# Patient Record
Sex: Male | Born: 1990 | Race: Black or African American | Hispanic: No | Marital: Single | Smoking: Current every day smoker
Health system: Southern US, Community
[De-identification: ages and names within clinical notes are randomized; demographics above are authoritative.]

## PROBLEM LIST (undated history)

## (undated) DIAGNOSIS — G43909 Migraine, unspecified, not intractable, without status migrainosus: Secondary | ICD-10-CM

## (undated) DIAGNOSIS — J45909 Unspecified asthma, uncomplicated: Secondary | ICD-10-CM

## (undated) DIAGNOSIS — J302 Other seasonal allergic rhinitis: Secondary | ICD-10-CM

---
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
## 2016-01-03 ENCOUNTER — Encounter (HOSPITAL_BASED_OUTPATIENT_CLINIC_OR_DEPARTMENT_OTHER): Payer: Self-pay | Admitting: *Deleted

## 2016-01-03 ENCOUNTER — Emergency Department (HOSPITAL_BASED_OUTPATIENT_CLINIC_OR_DEPARTMENT_OTHER)
Admission: EM | Admit: 2016-01-03 | Discharge: 2016-01-03 | Disposition: A | Discharge status: Home / Self Care | Payer: Medicaid Other | Attending: Emergency Medicine | Admitting: Emergency Medicine

## 2016-01-03 DIAGNOSIS — K0889 Other specified disorders of teeth and supporting structures: Secondary | ICD-10-CM | POA: Diagnosis not present

## 2016-01-03 DIAGNOSIS — F17210 Nicotine dependence, cigarettes, uncomplicated: Secondary | ICD-10-CM | POA: Insufficient documentation

## 2016-01-03 DIAGNOSIS — Z8679 Personal history of other diseases of the circulatory system: Secondary | ICD-10-CM | POA: Insufficient documentation

## 2016-01-03 DIAGNOSIS — J45909 Unspecified asthma, uncomplicated: Secondary | ICD-10-CM | POA: Diagnosis not present

## 2016-01-03 DIAGNOSIS — J029 Acute pharyngitis, unspecified: Secondary | ICD-10-CM | POA: Diagnosis not present

## 2016-01-03 HISTORY — DX: Migraine, unspecified, not intractable, without status migrainosus: G43.909

## 2016-01-03 HISTORY — DX: Other seasonal allergic rhinitis: J30.2

## 2016-01-03 HISTORY — DX: Unspecified asthma, uncomplicated: J45.909

## 2016-01-03 LAB — RAPID STREP SCREEN (MED CTR MEBANE ONLY): STREPTOCOCCUS, GROUP A SCREEN (DIRECT): NEGATIVE

## 2016-01-03 MED ORDER — BUPIVACAINE-EPINEPHRINE (PF) 0.5% -1:200000 IJ SOLN
1.8000 mL | Freq: Once | INTRAMUSCULAR | Status: AC
  Administered 2016-01-03: 1.8 mL
  Filled 2016-01-03: qty 1.8

## 2016-01-03 MED ORDER — OXYCODONE-ACETAMINOPHEN 5-325 MG PO TABS: 1.0000 | ORAL_TABLET | ORAL | Status: DC | PRN

## 2016-01-03 MED ORDER — PENICILLIN V POTASSIUM 500 MG PO TABS: 500.0000 mg | ORAL_TABLET | Freq: Four times a day (QID) | ORAL | Status: AC

## 2016-01-03 MED ORDER — OXYCODONE-ACETAMINOPHEN 5-325 MG PO TABS
1.0000 | ORAL_TABLET | Freq: Once | ORAL | Status: AC
Start: 2016-01-03 — End: 2016-01-03
  Administered 2016-01-03: 1 via ORAL
  Filled 2016-01-03: qty 1

## 2016-01-03 MED ORDER — PENICILLIN V POTASSIUM 500 MG PO TABS: 500.0000 mg | ORAL_TABLET | Freq: Four times a day (QID) | ORAL | Status: DC

## 2016-01-03 NOTE — ED Provider Notes (Signed)
CSN: [id number]     Arrival date & time 01/03/16  1348 History   First MD Initiated Contact with Patient 01/03/16 1540     Chief Complaint  Patient presents with  . Sore Throat  . Dental Pain     (Consider location/radiation/quality/duration/timing/severity/associated sxs/prior Treatment) HPI   Javier Randolph is a 25 y.o. male, patient with no pertinent past medical history, presenting to the ED with bilateral lower jaw pain and a sore throat that began yesterday. Pt rates his sore throat at 4/10, aching, nonradiating. Pt rates his tooth pain at 4/10, throbbing, nonradiating. Both pains improved with percocet in triage. Pt has tried tylenol at home with minimal relief. Pt denies fever/chills, nausea/vomiting, cough, shortness of breath or difficulty swallowing, or any other complaints.     Past Medical History  Diagnosis Date  . Asthma   . Migraines   . Seasonal allergies    History reviewed. No pertinent past surgical history. No family history on file. Social History  Substance Use Topics  . Smoking status: Current Every Day Smoker    Types: Cigars  . Smokeless tobacco: Never Used  . Alcohol Use: Yes     Comment: 3beers/week    Review of Systems  Constitutional: Negative for fever and chills.  HENT: Positive for dental problem and sore throat.   Respiratory: Negative for shortness of breath.   Cardiovascular: Negative for chest pain.  Gastrointestinal: Negative for nausea and vomiting.  Skin: Negative for color change and pallor.  Neurological: Negative for headaches.    Allergies  Motrin  Home Medications   Prior to Admission medications   Medication Sig Start Date End Date Taking? Authorizing Provider  oxyCODONE-acetaminophen (PERCOCET/ROXICET) 5-325 MG tablet Take 1 tablet by mouth every 4 (four) hours as needed for severe pain. 01/03/16   Javier Hankin Randolph Lashae Wollenberg, PA-Randolph  penicillin v potassium (VEETID) 500 MG tablet Take 1 tablet (500 mg total) by mouth 4 (four) times  daily. 01/03/16 01/10/16  Javier Randolph Randolph Jayland Null, PA-Randolph   BP 131/85 mmHg  Pulse 56  Temp(Src) 98.7 F (37.1 Randolph) (Oral)  Resp 16  Ht 5\' 9"  (1.753 m)  Wt 70.308 kg  BMI 22.88 kg/m2  SpO2 99% Physical Exam  Constitutional: He appears well-developed and well-nourished. No distress.  HENT:  Head: Normocephalic and atraumatic.  Mouth/Throat: Uvula is midline and mucous membranes are normal. Posterior oropharyngeal erythema present. No oropharyngeal exudate, posterior oropharyngeal edema or tonsillar abscesses.  Tenderness bilaterally to the gingiva posterior to the rearmost lower molars. No discernible swelling or fluctuance that would suggest an abscess.  Eyes: Conjunctivae are normal.  Neck: Normal range of motion.  Cardiovascular: Normal rate and regular rhythm.   Pulmonary/Chest: Effort normal and breath sounds normal.  Lymphadenopathy:    He has no cervical adenopathy.  Neurological: He is alert.  Skin: Skin is warm and dry. He is not diaphoretic.  Nursing note and vitals reviewed.   ED Course  .Nerve Block Date/Time: 01/03/2016 4:07 PM Performed by: Javier Randolph, Javier Randolph Authorized by: Javier Randolph, Javier Randolph Randolph Consent: Verbal consent obtained. Risks and benefits: risks, benefits and alternatives were discussed Consent given by: patient Patient understanding: patient states understanding of the procedure being performed Patient consent: the patient's understanding of the procedure matches consent given Procedure consent: procedure consent matches procedure scheduled Patient identity confirmed: verbally with patient and arm band Time out: Immediately prior to procedure a "time out" was called to verify the correct patient, procedure, equipment, support staff and site/side marked as  required. Indications: pain relief Body area: face/mouth Nerve: inferior alveolar Laterality: right Patient sedated: no Patient position: supine Needle gauge: 27 G Location technique: anatomical landmarks Local anesthetic:  bupivacaine 0.5% with epinephrine Anesthetic total: 1.8 ml Outcome: pain improved Patient tolerance: Patient tolerated the procedure well with no immediate complications  .Nerve Block Date/Time: 01/03/2016 4:07 PM Performed by: Javier Pancoast Authorized by: Javier Pancoast Consent: Verbal consent obtained. Risks and benefits: risks, benefits and alternatives were discussed Consent given by: patient Patient understanding: patient states understanding of the procedure being performed Patient consent: the patient's understanding of the procedure matches consent given Procedure consent: procedure consent matches procedure scheduled Patient identity confirmed: verbally with patient and arm band Time out: Immediately prior to procedure a "time out" was called to verify the correct patient, procedure, equipment, support staff and site/side marked as required. Indications: pain relief Body area: face/mouth Nerve: inferior alveolar Laterality: left Patient sedated: no Patient position: supine Needle gauge: 27 G Location technique: anatomical landmarks Local anesthetic: bupivacaine 0.5% with epinephrine Anesthetic total: 1.8 ml Outcome: pain improved Patient tolerance: Patient tolerated the procedure well with no immediate complications   (including critical care time) Labs Review Labs Reviewed  RAPID STREP SCREEN (NOT AT [facility])  CULTURE, GROUP A STREP [facility])    Imaging Review No results found. I have personally reviewed and evaluated these lab results as part of my medical decision-making.   EKG Interpretation None      MDM   Final diagnoses:  Sore throat  Pain, dental    Javier Randolph presents with bilateral jaw pain and sore throat since yesterday morning.  Suspect that the source of the patient's job pain is possibly impending eruption of wisdom teeth. Sore throat is likely a viral pharyngitis. Inferior alveolar blocks improved pain. Patient placed on an antibiotic in  anticipation of possible tooth extraction. Patient was advised that he would need to follow-up with a dentist within the timeframe that he is still on antibiotic. Home care and return precautions discussed. Patient voiced understanding of these instructions and is comfortable with discharge.    Javier Pancoast, PA-Randolph 01/03/16 1631  Marily Memos, MD 01/06/16 [phone]

## 2016-01-03 NOTE — ED Notes (Signed)
Pt reports sore throat since yesterday morning; also reports L upper tooth pain. Throat slightly red. Minimal swelling to L upper, back molar area. Denies fever, cough, other concerning symptoms.

## 2016-01-03 NOTE — Discharge Instructions (Signed)
You have been seen today for dental pain and sore throat.  Sore throat: There strep test was negative, indicating that the source of her illness is likely a virus. Viruses do not require antibiotics. Treatment is symptomatic care. Drink plenty of fluids and get plenty of rest. Warm liquids or Chloraseptic spray may help soothe the sore throat.  Tooth pain: You must follow-up with a dentist as soon as possible, but certainly within the time you're still taking the antibiotic. Follow up with PCP as needed. Return to ED should symptoms worsen.  RESOURCE GUIDE  Chronic Pain Problems: Contact [facility]  [phone] Patients need to be referred by their primary care doctor.  Insufficient Money for Medicine: Contact United Way:  call "211" or [facility] [phone].  No Primary Care Doctor: - Call [facility] Connect  [phone] - can help you locate a primary care doctor that  accepts your insurance, provides certain services, etc. - Physician Referral Service- [phone]  Agencies that provide inexpensive medical care: - [facility]  [phone] - [facility]  [phone] - [facility]  [phone] - Women's Clinic  [phone] - Planned Parenthood  [phone] Haynes Bast[facility]  [phone]  Medicaid-accepting [place] [facility] Providers: - [facility]- [address], [address]  [phone], Mon-Fri 9am-7pm, Sat 9am-1pm - [facility]- [address], [address] [place]  [phone] - [facility]- [address], [address] [place]  [phone] [facility]- [facility]- [address]  [phone] - Renaye RakersVeita Bland- [address], [address], [phone]  Only accepts [place] patients after they have their name  applied to their card  Self Pay (no insurance) [place]: - Sickle Cell Patients: Dr Willey BladeEric Dean, [place] [facility]  [address], [phone] - [facility]- [address]  [phone]       Redge Gainer-     [facility] [place]- [address], [address]       -     [facility]- see information above (Speak to CitigroupPam H if you do not have insurance)       -  [facility]- [address], [phone]       -  [facility]- [address],  [phone]       -  [facility]- [address], [phone]       -  Dr Julio Sickssei-Bonsu-  [address], [address], [place], [phone]       -  [facility]- [address], [phone]       -  [facility] [place]- [address], [phone], also [address], [phone]       -    [facility]- [address], [phone], 1st & 3rd Saturday   every month, 10am-1pm  1) Find a Doctor and Pay Out of Pocket Although you won't have to find out who is covered by your insurance plan, it is a good idea to ask around and get recommendations. You will then need to call the office and see if the doctor you have chosen will accept you as a new patient and what types of options they offer for patients who are self-pay. Some doctors offer discounts or will set up payment plans  for their patients who do not have insurance, but you will need to ask so you aren't surprised when you get to your appointment.  2) Contact Your Local Health Department Not all health departments have doctors that can see patients for sick visits, but many do, so it is worth a call to see if yours does. If you don't know where your local health department is, you can check in your phone book. The CDC also has a tool to help you locate your state's health department, and many state websites also have listings of all of their local health departments.  3) Find a Walk-in Clinic If your illness is not likely to be very severe or complicated, you may want to try a walk in clinic. These are popping up all over the country in  pharmacies, drugstores, and shopping centers. They're usually staffed by nurse practitioners or physician assistants that have been trained to treat common illnesses and complaints. They're usually fairly quick and inexpensive. However, if you have serious medical issues or chronic medical problems, these are probably not your best option  STD Testing - [facility] Department of [facility] [place], STD Clinic, [address], [place], phone [phone] or [phone].  Monday - Friday, call for an appointment. [facility] Department of [employer], STD Clinic, [address], [place], phone [phone] or [phone].  Monday - Friday, call for an appointment.  Abuse/Neglect: [facility] Child Abuse Hotline 2516913567 [facility] Child Abuse Hotline 281-295-7829 (After Hours)  Emergency Shelter:  [facility] 919-376-3847  Maternity Homes: - [facility] at [facility] [phone] - [facility] [phone]  MRSA Hotline #:   289-821-6713  [facility] Resources  [facility]  United Way [facility]. [address]                 [address]         [address] [place] [address]  Blondell Reveal Phone:  [phone]                                  Phone:  [phone]                   Phone:  [phone]  [facility], [phone] - [facility] - [facility]670 [phone]       -     [facility] in [place], [address]                                  [phone], Insurance  [place] Child Abuse Hotline 8728272317 or 512-368-6638 (After Hours)   Behavioral Health Services  Substance Abuse Resources: - Alcohol and Drug Services  [phone] - [facility]  [phone] -  [facility] [place] [phone] Floydene Flock [phone] - Residential & Outpatient Substance Abuse Program  [phone]  Psychological Services: [facility]  [phone] Services  [phone] - [facility], [phone] [place]. [address], [place], ACCESS LINE: 902-142-1781 or 712-101-7987, [employer].za  Dental Assistance  If unable to pay or uninsured, contact:  [facility] or [facility]. to become qualified for the adult dental clinic.  Patients with Medicaid: [facility] [phone], [phone] [address] [phone]  If unable to pay, or uninsured, contact [facility] [phone]) or [facility] Department [phone] in [place], [phone] in [facility]) to become qualified for the adult dental clinic   Other Low-Cost Community Dental Services: - [facility]- [address] [place], [place], [place], [address], [phone], Ext. [phone], 2nd and 4th Thursday of the month at 6:30am.  10 clients each day by appointment, can sometimes see walk-in patients if someone does not show for an appointment. [facility]- [address] [place], [place], [address], [phone] - [facility]- [address] [place], [place], [address], [phone] - [facility]- [phone] [facility] Department- [phone] [facility] Department- [phone]

## 2016-01-05 LAB — CULTURE, GROUP A STREP (THRC)

## 2016-03-03 ENCOUNTER — Emergency Department (HOSPITAL_COMMUNITY): Payer: Medicaid Other

## 2016-03-03 ENCOUNTER — Encounter (HOSPITAL_COMMUNITY): Payer: Self-pay

## 2016-03-03 ENCOUNTER — Emergency Department (HOSPITAL_COMMUNITY)
Admission: EM | Admit: 2016-03-03 | Discharge: 2016-03-03 | Payer: Medicaid Other | Attending: Emergency Medicine | Admitting: Emergency Medicine

## 2016-03-03 DIAGNOSIS — Y9389 Activity, other specified: Secondary | ICD-10-CM | POA: Diagnosis not present

## 2016-03-03 DIAGNOSIS — Y9289 Other specified places as the place of occurrence of the external cause: Secondary | ICD-10-CM | POA: Diagnosis not present

## 2016-03-03 DIAGNOSIS — W1789XA Other fall from one level to another, initial encounter: Secondary | ICD-10-CM | POA: Diagnosis not present

## 2016-03-03 DIAGNOSIS — S0990XA Unspecified injury of head, initial encounter: Secondary | ICD-10-CM | POA: Diagnosis not present

## 2016-03-03 DIAGNOSIS — Z23 Encounter for immunization: Secondary | ICD-10-CM | POA: Insufficient documentation

## 2016-03-03 DIAGNOSIS — S61412A Laceration without foreign body of left hand, initial encounter: Secondary | ICD-10-CM | POA: Insufficient documentation

## 2016-03-03 DIAGNOSIS — S6992XA Unspecified injury of left wrist, hand and finger(s), initial encounter: Secondary | ICD-10-CM | POA: Diagnosis present

## 2016-03-03 DIAGNOSIS — W19XXXA Unspecified fall, initial encounter: Secondary | ICD-10-CM

## 2016-03-03 DIAGNOSIS — Y998 Other external cause status: Secondary | ICD-10-CM | POA: Insufficient documentation

## 2016-03-03 DIAGNOSIS — Z8679 Personal history of other diseases of the circulatory system: Secondary | ICD-10-CM | POA: Diagnosis not present

## 2016-03-03 DIAGNOSIS — J45909 Unspecified asthma, uncomplicated: Secondary | ICD-10-CM | POA: Diagnosis not present

## 2016-03-03 DIAGNOSIS — F17210 Nicotine dependence, cigarettes, uncomplicated: Secondary | ICD-10-CM | POA: Diagnosis not present

## 2016-03-03 DIAGNOSIS — IMO0002 Reserved for concepts with insufficient information to code with codable children: Secondary | ICD-10-CM

## 2016-03-03 LAB — COMPREHENSIVE METABOLIC PANEL
ALK PHOS: 48 U/L (ref 38–126)
ALT: 20 U/L (ref 17–63)
AST: 44 U/L — AB (ref 15–41)
Albumin: 4.1 g/dL (ref 3.5–5.0)
Anion gap: 16 — ABNORMAL HIGH (ref 5–15)
BILIRUBIN TOTAL: 0.7 mg/dL (ref 0.3–1.2)
BUN: 10 mg/dL (ref 6–20)
CALCIUM: 8.6 mg/dL — AB (ref 8.9–10.3)
CHLORIDE: 106 mmol/L (ref 101–111)
CO2: 19 mmol/L — ABNORMAL LOW (ref 22–32)
CREATININE: 0.95 mg/dL (ref 0.61–1.24)
Glucose, Bld: 91 mg/dL (ref 65–99)
Potassium: 3.3 mmol/L — ABNORMAL LOW (ref 3.5–5.1)
Sodium: 141 mmol/L (ref 135–145)
Total Protein: 7.8 g/dL (ref 6.5–8.1)

## 2016-03-03 LAB — I-STAT CHEM 8, ED
BUN: 12 mg/dL (ref 6–20)
Calcium, Ion: 1.01 mmol/L — ABNORMAL LOW (ref 1.12–1.23)
Chloride: 107 mmol/L (ref 101–111)
Creatinine, Ser: 1.2 mg/dL (ref 0.61–1.24)
Glucose, Bld: 86 mg/dL (ref 65–99)
HEMATOCRIT: 49 % (ref 39.0–52.0)
Hemoglobin: 16.7 g/dL (ref 13.0–17.0)
Potassium: 3.3 mmol/L — ABNORMAL LOW (ref 3.5–5.1)
SODIUM: 144 mmol/L (ref 135–145)
TCO2: 20 mmol/L (ref 0–100)

## 2016-03-03 LAB — URINALYSIS, ROUTINE W REFLEX MICROSCOPIC
BILIRUBIN URINE: NEGATIVE
GLUCOSE, UA: NEGATIVE mg/dL
Hgb urine dipstick: NEGATIVE
KETONES UR: NEGATIVE mg/dL
LEUKOCYTES UA: NEGATIVE
NITRITE: NEGATIVE
PH: 5.5 (ref 5.0–8.0)
PROTEIN: NEGATIVE mg/dL
Specific Gravity, Urine: 1.019 (ref 1.005–1.030)

## 2016-03-03 LAB — CBC
HCT: 42.7 % (ref 39.0–52.0)
Hemoglobin: 14.8 g/dL (ref 13.0–17.0)
MCH: 31 pg (ref 26.0–34.0)
MCHC: 34.7 g/dL (ref 30.0–36.0)
MCV: 89.3 fL (ref 78.0–100.0)
PLATELETS: 160 10*3/uL (ref 150–400)
RBC: 4.78 MIL/uL (ref 4.22–5.81)
RDW: 13 % (ref 11.5–15.5)
WBC: 8.8 10*3/uL (ref 4.0–10.5)

## 2016-03-03 LAB — RAPID URINE DRUG SCREEN, HOSP PERFORMED
AMPHETAMINES: NOT DETECTED
Barbiturates: NOT DETECTED
Benzodiazepines: NOT DETECTED
Cocaine: NOT DETECTED
Opiates: NOT DETECTED
TETRAHYDROCANNABINOL: POSITIVE — AB

## 2016-03-03 LAB — ETHANOL: ALCOHOL ETHYL (B): 180 mg/dL — AB (ref <5)

## 2016-03-03 MED ORDER — LORAZEPAM 2 MG/ML IJ SOLN
1.0000 mg | Freq: Once | INTRAMUSCULAR | Status: AC
  Administered 2016-03-03: 1 mg via INTRAVENOUS
  Filled 2016-03-03: qty 1

## 2016-03-03 MED ORDER — LIDOCAINE-EPINEPHRINE (PF) 2 %-1:200000 IJ SOLN
20.0000 mL | Freq: Once | INTRAMUSCULAR | Status: AC
  Administered 2016-03-03: 20 mL
  Filled 2016-03-03: qty 20

## 2016-03-03 MED ORDER — HYDROCODONE-ACETAMINOPHEN 5-325 MG PO TABS: 1.0000 | ORAL_TABLET | Freq: Four times a day (QID) | ORAL | Status: DC | PRN

## 2016-03-03 MED ORDER — TETANUS-DIPHTH-ACELL PERTUSSIS 5-2.5-18.5 LF-MCG/0.5 IM SUSP
0.5000 mL | Freq: Once | INTRAMUSCULAR | Status: AC
  Administered 2016-03-03: 0.5 mL via INTRAMUSCULAR
  Filled 2016-03-03: qty 0.5

## 2016-03-03 MED ORDER — IOPAMIDOL (ISOVUE-300) INJECTION 61%
INTRAVENOUS | Status: AC
Start: 2016-03-03 — End: 2016-03-03
  Administered 2016-03-03: 06:00:00
  Filled 2016-03-03: qty 100

## 2016-03-03 MED ORDER — SODIUM CHLORIDE 0.9 % IV SOLN
INTRAVENOUS | Status: DC
  Administered 2016-03-03: 07:00:00 via INTRAVENOUS

## 2016-03-03 MED ORDER — SODIUM CHLORIDE 0.9 % IV BOLUS (SEPSIS)
1000.0000 mL | Freq: Once | INTRAVENOUS | Status: AC
  Administered 2016-03-03: 1000 mL via INTRAVENOUS

## 2016-03-03 MED ORDER — SODIUM CHLORIDE 0.9 % IV BOLUS (SEPSIS): 125.0000 mL | Freq: Once | INTRAVENOUS | Status: DC

## 2016-03-03 NOTE — ED Notes (Signed)
[facility] at [employer]. Witnessed fall off a 14 ft building, broken window at business, pt has 3-4 laceration under left armpit, abrasion to L wrist, sluggish L pupil upon EMS arrival, now resolved. Pt has ETOH on board, refusing spinal immobilization, and removed rolled up towel from neck.

## 2016-03-03 NOTE — ED Notes (Signed)
c-collar removed per Dr Elesa MassedWard  Pt tolerated PO fluid well with no vomiting

## 2016-03-03 NOTE — ED Notes (Signed)
Vital signs stable. 

## 2016-03-03 NOTE — ED Provider Notes (Signed)
LACERATION REPAIR Performed by: Cheri FowlerKayla Yisroel Mullendore Consent: Verbal consent obtained. Risks and benefits: risks, benefits and alternatives were discussed Patient identity confirmed: provided demographic data Time out performed prior to procedure Prepped and Draped in normal sterile fashion Wound explored Laceration Location: Medial aspect of left upper extremity just distal to the axilla.  Laceration Length: 4 cm No Foreign Bodies seen or palpated Anesthesia: local infiltration Local anesthetic: lidocaine 2% with epinephrine Anesthetic total: 8 ml Irrigation method: syringe Amount of cleaning: standard Skin closure: 4-0 prolene Number of sutures or staples: 5 Technique: simple interrupted Patient tolerance: Patient tolerated the procedure well with no immediate complications.   Cheri FowlerKayla Curtez Brallier, PA-C 03/03/16 [phone]

## 2016-03-03 NOTE — Discharge Instructions (Signed)
Head Injury, Adult °You have a head injury. Headaches and throwing up (vomiting) are common after a head injury. It should be easy to wake up from sleeping. Sometimes you must stay in the hospital. Most problems happen within the first 24 hours. Side effects may occur up to 7-10 days after the injury.  °WHAT ARE THE TYPES OF HEAD INJURIES? °Head injuries can be as minor as a bump. Some head injuries can be more severe. More severe head injuries include: °· A jarring injury to the brain (concussion). °· A bruise of the brain (contusion). This mean there is bleeding in the brain that can cause swelling. °· A cracked skull (skull fracture). °· Bleeding in the brain that collects, clots, and forms a bump (hematoma). °WHEN SHOULD I GET HELP RIGHT AWAY?  °· You are confused or sleepy. °· You cannot be woken up. °· You feel sick to your stomach (nauseous) or keep throwing up (vomiting). °· Your dizziness or unsteadiness is getting worse. °· You have very bad, lasting headaches that are not helped by medicine. Take medicines only as told by your doctor. °· You cannot use your arms or legs like normal. °· You cannot walk. °· You notice changes in the black spots in the center of the colored part of your eye (pupil). °· You have clear or bloody fluid coming from your nose or ears. °· You have trouble seeing. °During the next 24 hours after the injury, you must stay with someone who can watch you. This person should get help right away (call 911 in the [place] if you start to shake and are not able to control it (have seizures), you pass out, or you are unable to wake up. °HOW CAN I PREVENT A HEAD INJURY IN THE FUTURE? °· Wear seat belts. °· Wear a helmet while bike riding and playing sports like football. °· Stay away from dangerous activities around the house. °WHEN CAN I RETURN TO NORMAL ACTIVITIES AND ATHLETICS? °See your doctor before doing these activities. You should not do normal activities or play contact sports until 1  week after the following symptoms have stopped: °· Headache that does not go away. °· Dizziness. °· Poor attention. °· Confusion. °· Memory problems. °· Sickness to your stomach or throwing up. °· Tiredness. °· Fussiness. °· Bothered by bright lights or loud noises. °· Anxiousness or depression. °· Restless sleep. °MAKE SURE YOU:  °· Understand these instructions. °· Will watch your condition. °· Will get help right away if you are not doing well or get worse. °  °This information is not intended to replace advice given to you by your health care provider. Make sure you discuss any questions you have with your health care provider. °  °Document Released: 09/15/2008 Document Revised: 10/24/2014 Document Reviewed: 06/10/2013 °Elsevier Interactive Patient Education ©2016 [employer] ° °Laceration Care, Adult °A laceration is a cut that goes through all of the layers of the skin and into the tissue that is right under the skin. Some lacerations heal on their own. Others need to be closed with stitches (sutures), staples, skin adhesive strips, or skin glue. Proper laceration care minimizes the risk of infection and helps the laceration to heal better. °HOW TO CARE FOR YOUR LACERATION °If sutures or staples were used: °· Keep the wound clean and dry. °· If you were given a bandage (dressing), you should change it at least one time per day or as told by your health care provider. You should also   change it if it becomes wet or dirty. °· Keep the wound completely dry for the first 24 hours or as told by your health care provider. After that time, you may shower or bathe. However, make sure that the wound is not soaked in water until after the sutures or staples have been removed. °· Clean the wound one time each day or as told by your health care provider: °¨ Wash the wound with soap and water. °¨ Rinse the wound with water to remove all soap. °¨ Pat the wound dry with a clean towel. Do not rub the wound. °· After cleaning  the wound, apply a thin layer of antibiotic ointment as told by your health care provider. This will help to prevent infection and keep the dressing from sticking to the wound. °· Have the sutures or staples removed as told by your health care provider. °If skin adhesive strips were used: °· Keep the wound clean and dry. °· If you were given a bandage (dressing), you should change it at least one time per day or as told by your health care provider. You should also change it if it becomes dirty or wet. °· Do not get the skin adhesive strips wet. You may shower or bathe, but be careful to keep the wound dry. °· If the wound gets wet, pat it dry with a clean towel. Do not rub the wound. °· Skin adhesive strips fall off on their own. You may trim the strips as the wound heals. Do not remove skin adhesive strips that are still stuck to the wound. They will fall off in time. °If skin glue was used: °· Try to keep the wound dry, but you may briefly wet it in the shower or bath. Do not soak the wound in water, such as by swimming. °· After you have showered or bathed, gently pat the wound dry with a clean towel. Do not rub the wound. °· Do not do any activities that will make you sweat heavily until the skin glue has fallen off on its own. °· Do not apply liquid, cream, or ointment medicine to the wound while the skin glue is in place. Using those may loosen the film before the wound has healed. °· If you were given a bandage (dressing), you should change it at least one time per day or as told by your health care provider. You should also change it if it becomes dirty or wet. °· If a dressing is placed over the wound, be careful not to apply tape directly over the skin glue. Doing that may cause the glue to be pulled off before the wound has healed. °· Do not pick at the glue. The skin glue usually remains in place for 5-10 days, then it falls off of the skin. °General Instructions °· Take over-the-counter and  prescription medicines only as told by your health care provider. °· If you were prescribed an antibiotic medicine or ointment, take or apply it as told by your doctor. Do not stop using it even if your condition improves. °· To help prevent scarring, make sure to cover your wound with sunscreen whenever you are outside after stitches are removed, after adhesive strips are removed, or when glue remains in place and the wound is healed. Make sure to wear a sunscreen of at least 30 SPF. °· Do not scratch or pick at the wound. °· Keep all follow-up visits as told by your health care provider. This is important. °·   Check your wound every day for signs of infection. Watch for: °¨ Redness, swelling, or pain. °¨ Fluid, blood, or pus. °· Raise (elevate) the injured area above the level of your heart while you are sitting or lying down, if possible. °SEEK MEDICAL CARE IF: °· You received a tetanus shot and you have swelling, severe pain, redness, or bleeding at the injection site. °· You have a fever. °· A wound that was closed breaks open. °· You notice a bad smell coming from your wound or your dressing. °· You notice something coming out of the wound, such as wood or glass. °· Your pain is not controlled with medicine. °· You have increased redness, swelling, or pain at the site of your wound. °· You have fluid, blood, or pus coming from your wound. °· You notice a change in the color of your skin near your wound. °· You need to change the dressing frequently due to fluid, blood, or pus draining from the wound. °· You develop a new rash. °· You develop numbness around the wound. °SEEK IMMEDIATE MEDICAL CARE IF: °· You develop severe swelling around the wound. °· Your pain suddenly increases and is severe. °· You develop painful lumps near the wound or on skin that is anywhere on your body. °· You have a red streak going away from your wound. °· The wound is on your hand or foot and you cannot properly move a finger or  toe. °· The wound is on your hand or foot and you notice that your fingers or toes look pale or bluish. °  °This information is not intended to replace advice given to you by your health care provider. Make sure you discuss any questions you have with your health care provider. °  °Document Released: 10/03/2005 Document Revised: 02/17/2015 Document Reviewed: 09/29/2014 °Elsevier Interactive Patient Education ©2016 Else[employer] Inc. ° °

## 2016-03-03 NOTE — ED Provider Notes (Signed)
TIME SEEN: 5:00 AM  CHIEF COMPLAINT: Fall  HPI: Pt is a 25 y.o. male with history of asthma, migraine headaches who presents to the emergency department after he had a witnessed fall off of a 14 foot building. He states that he was drinking alcohol and smoking marijuana with friends tonight. He states the next thing he remembers was being on the ground with a flashlight in his face. He states that he was told that he had broken into a building and had fallen off the top of that building. States he does not remember these things. States he is hurting all over. Denies numbness, tingling or focal weakness. He is not sure when his last tetanus vaccination was. Is not on any medications including anticoagulants.  ROS: Level V caveat for intoxication, level II trauma  PAST MEDICAL HISTORY/PAST SURGICAL HISTORY:  Past Medical History  Diagnosis Date  . Asthma   . Migraines   . Seasonal allergies     MEDICATIONS:  Prior to Admission medications   Medication Sig Start Date End Date Taking? Authorizing Provider  oxyCODONE-acetaminophen (PERCOCET/ROXICET) 5-325 MG tablet Take 1 tablet by mouth every 4 (four) hours as needed for severe pain. 01/03/16   Shawn C Joy, PA-C    ALLERGIES:  Allergies  Allergen Reactions  . Motrin [Ibuprofen] Anaphylaxis    Tongue and throat swelling    SOCIAL HISTORY:  Social History  Substance Use Topics  . Smoking status: Current Every Day Smoker    Types: Cigars  . Smokeless tobacco: Never Used  . Alcohol Use: Yes     Comment: 3beers/week    FAMILY HISTORY: No family history on file.  EXAM: BP 123/64 mmHg  Pulse 81  Temp(Src) 98 F (36.7 C) (Oral)  Resp 18  Ht  (1.676 m)  Wt 155 lb (70.308 kg)  BMI 25.03 kg/m2  SpO2 96% CONSTITUTIONAL: Alert and oriented and responds appropriately to questions.Appears intoxicated but is oriented 3, does fall asleep during questioning, GCS 14-15 HEAD: Normocephalic; atraumatic EYES: Conjunctivae injected  bilaterally, PERRL, EOMI ENT: normal nose; no rhinorrhea; moist mucous membranes; pharynx without lesions noted; no dental injury; no septal hematoma NECK: Supple, no meningismus, no LAD; no midline spinal tenderness, step-off or deformity CARD: RRR; S1 and S2 appreciated; no murmurs, no clicks, no rubs, no gallops RESP: Normal chest excursion without splinting or tachypnea; breath sounds clear and equal bilaterally; no wheezes, no rhonchi, no rales; no hypoxia or respiratory distress CHEST:  chest wall stable, no crepitus or ecchymosis or deformity, diffusely tender to palpation ABD/GI: Normal bowel sounds; non-distended; soft, non-tender, no rebound, no guarding PELVIS:  stable, nontender to palpation BACK:  The back appears normal and is non-tender to palpation, there is no CVA tenderness; no midline spinal tenderness, step-off or deformity EXT: Normal ROM in all joints; non-tender to palpation; no edema; normal capillary refill; no cyanosis, no bony tenderness or bony deformity of patient's extremities, no joint effusion, no ecchymosis, multiple abrasions to his extremities, 4 cm laceration just below the axilla on the left side on the lateral chest wall    SKIN: Normal color for age and race; warm NEURO: Moves all extremities equally, sensation to light touch intact diffusely, cranial nerves II through XII intact   MEDICAL DECISION MAKING: Patient here with significant fall. We have activated a level II trauma. We'll obtain CT imaging of his head, cervical spine, chest, abdomen and pelvis. We have placed him in a cervical collar. He has become more drowsy  during questioning and becomes combative with stimulation. We'll give Ativan to keep him calm so we can get imaging and keep the c-collar on him.  We will update his tetanus vaccination in his laceration will need repair. We'll also obtain an x-ray of the left shoulder given laceration is just distal to the axilla.  ED PROGRESS: 6:00 AM   Patient's labs are unremarkable other than alcohol level of the 180. Portable chest and pelvis x-ray showed no acute abnormality. X-ray of the left shoulder also normal. CT scans pending.   7:30 AM  Pt's imaging shows no acute abnormality. He is still neurologically intact. C-collar has been removed. He really patient, fluid challenge. Well-appearing laceration. He will be discharged to jail with police at bedside because he was found breaking and entering into the building that he fell off of tonight. We'll give him head injury return precautions. We'll discharge with prescription for Vicodin to take as needed.   At this time, I do not feel there is any life-threatening condition present. I have reviewed and discussed all results (EKG, imaging, lab, urine as appropriate), exam findings with patient. I have reviewed nursing notes and appropriate previous records.  I feel the patient is safe to be discharged home without further emergent workup. Discussed usual and customary return precautions. Patient and family (if present) verbalize understanding and are comfortable with this plan.  Patient will follow-up with their primary care provider. If they do not have a primary care provider, information for follow-up has been provided to them. All questions have been answered.   Layla MawKristen N Tishanna Dunford, DO 03/03/16 [phone]

## 2016-03-04 LAB — SAMPLE TO BLOOD BANK

## 2016-03-12 ENCOUNTER — Emergency Department (HOSPITAL_COMMUNITY)
Admission: EM | Admit: 2016-03-12 | Discharge: 2016-03-12 | Disposition: A | Discharge status: Home / Self Care | Payer: Medicaid Other | Attending: Emergency Medicine | Admitting: Emergency Medicine

## 2016-03-12 ENCOUNTER — Encounter (HOSPITAL_COMMUNITY): Payer: Self-pay | Admitting: *Deleted

## 2016-03-12 DIAGNOSIS — J45909 Unspecified asthma, uncomplicated: Secondary | ICD-10-CM | POA: Diagnosis not present

## 2016-03-12 DIAGNOSIS — Z4802 Encounter for removal of sutures: Secondary | ICD-10-CM | POA: Diagnosis not present

## 2016-03-12 DIAGNOSIS — F17210 Nicotine dependence, cigarettes, uncomplicated: Secondary | ICD-10-CM | POA: Diagnosis not present

## 2016-03-12 DIAGNOSIS — Z79891 Long term (current) use of opiate analgesic: Secondary | ICD-10-CM | POA: Insufficient documentation

## 2016-03-12 NOTE — ED Notes (Signed)
Here for sutural removal under left arm

## 2016-03-12 NOTE — ED Provider Notes (Signed)
CSN: [id number]     Arrival date & time 03/12/16  1136 History  By signing my name below, I, [name], attest that this documentation has been prepared under the direction and in the presence of Langston MaskerKaren Anatole Apollo, [place]-C. Electronically Signed: [name], ED Scribe. 03/12/2016. 12:17 PM.   Chief Complaint  Patient presents with  . Suture / Staple Removal   The history is provided by the patient. No language interpreter was used.  HPI Comments: Javier Randolph is a 25 y.o. male who presents to the Emergency Department requesting a suture removal. Pt had a laceration repair on 03/03/16 in the left axilla following a fall. He denies numbness, weakness, drainage, rash, fever, or chills .  Past Medical History  Diagnosis Date  . Asthma   . Migraines   . Seasonal allergies    History reviewed. No pertinent past surgical history. History reviewed. No pertinent family history. Social History  Substance Use Topics  . Smoking status: Current Every Day Smoker    Types: Cigars  . Smokeless tobacco: Never Used  . Alcohol Use: Yes     Comment: 3beers/week    Review of Systems  Constitutional: Negative for fever and chills.  Skin: Positive for wound. Negative for rash.  Neurological: Negative for weakness and numbness.  All other systems reviewed and are negative.  Allergies  Motrin  Home Medications   Prior to Admission medications   Medication Sig Start Date End Date Taking? Authorizing Provider  HYDROcodone-acetaminophen (NORCO/VICODIN) 5-325 MG tablet Take 1-2 tablets by mouth every 6 (six) hours as needed. 03/03/16   Kristen N Ward, DO  oxyCODONE-acetaminophen (PERCOCET/ROXICET) 5-325 MG tablet Take 1 tablet by mouth every 4 (four) hours as needed for severe pain. 01/03/16   Shawn C Joy, PA-C   BP 116/62 mmHg  Temp(Src) 97.5 F (36.4 C) (Oral)  Resp 14  Ht 5\' 9"  (1.753 m)  Wt 160 lb (72.576 kg)  BMI 23.62 kg/m2  SpO2 97% Physical Exam  Constitutional: He is oriented to  person, place, and time. He appears well-developed and well-nourished.  HENT:  Head: Normocephalic and atraumatic.  Eyes: Conjunctivae and EOM are normal. Pupils are equal, round, and reactive to light.  Neck: Normal range of motion. Neck supple.  Musculoskeletal: Normal range of motion.  Neurological: He is alert and oriented to person, place, and time.  Skin: Skin is warm and dry.  5 sutures removed from left axilla; no signs of infection, no drainage noted  Psychiatric: He has a normal mood and affect. His behavior is normal.  Nursing note and vitals reviewed.   ED Course  Procedures (including critical care time) DIAGNOSTIC STUDIES: Oxygen Saturation is 97% on RA, normal by my interpretation.    COORDINATION OF CARE: 12:16 PM-Discussed treatment plan which includes suture removal with pt at bedside and pt agreed to plan.   Labs Review Labs Reviewed - No data to display  Imaging Review No results found. I have personally reviewed and evaluated these images and lab results as part of my medical decision-making.   EKG Interpretation None      MDM   Pt to ER for suture removal and wound check as above. Procedure tolerated well. Vitals normal, no signs of infection. Scar minimization & return precautions given at dc.   Final diagnoses:  Visit for suture removal    An After Visit Summary was printed and given to the patient.  Elson AreasLeslie K Tamsen Reist, PA-C 03/12/16 1220  Tilden FossaElizabeth Rees, MD 03/13/16  0857 

## 2016-03-12 NOTE — Discharge Instructions (Signed)

## 2016-03-12 NOTE — ED Notes (Signed)
Declined W/C at D/C and was escorted to lobby by RN. 

## 2016-09-14 IMAGING — CT CT HEAD W/O CM
3 of 7 series · 15 of 47 positions shown, 18 images · non-contrast
Comparison: None.

CLINICAL DATA: Patient fell off filling

EXAM:
CT HEAD WITHOUT CONTRAST
CT CERVICAL SPINE WITHOUT CONTRAST
TECHNIQUE: Multidetector CT imaging of the head and cervical spine was
performed following the standard protocol without intravenous
contrast. Multiplanar CT image reconstructions of the cervical spine
were also generated.

[Series 205: coronal st, idose (1) · coronal · 0.40mm/px · 3 of 71 slices shown]
[im 24/71  brain]
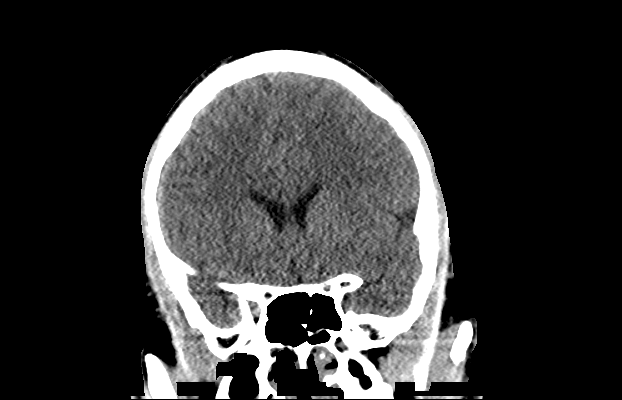
[im 36/71  brain]
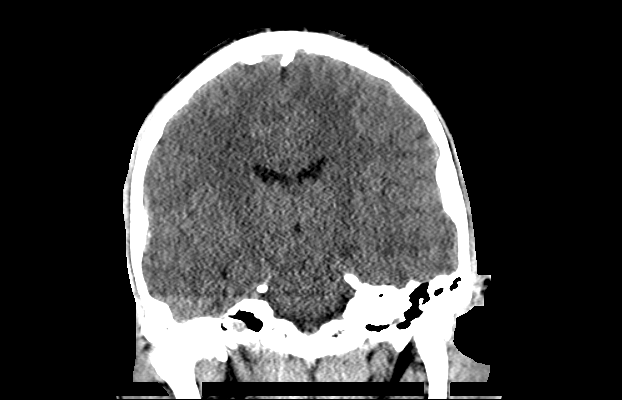
[im 47/71  brain]
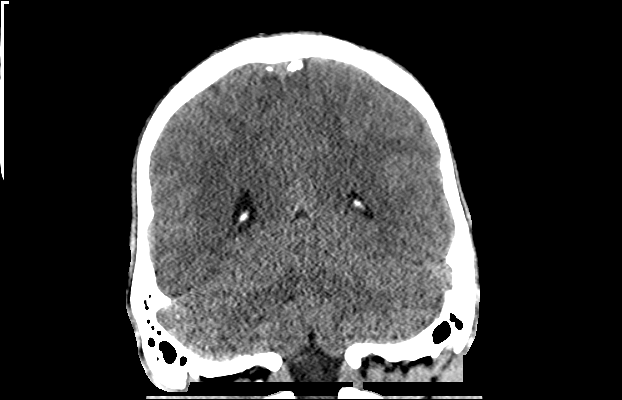

[Series 302: soft tissue, idose (2) · axial · 0.41mm/px · z∈[-67,+133]mm · 10 of 114 slices shown, 13 images]
[im 7/114  brain]
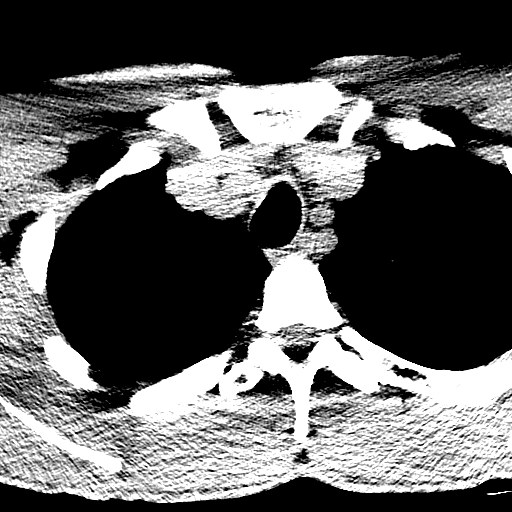
[im 7/114  bone]
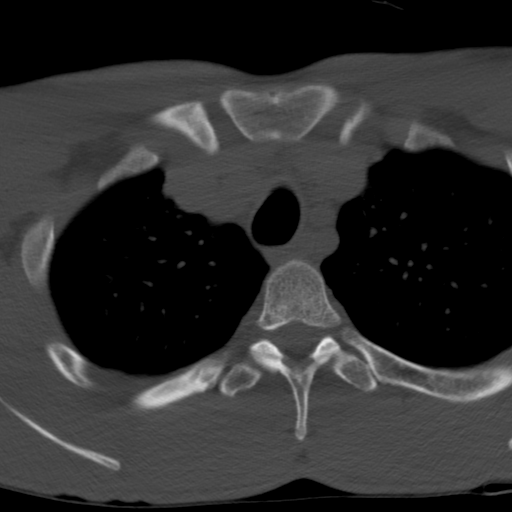
[im 19/114  brain]
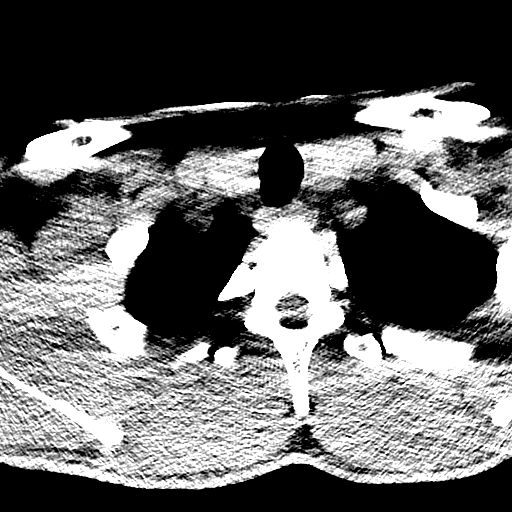
[im 32/114  brain]
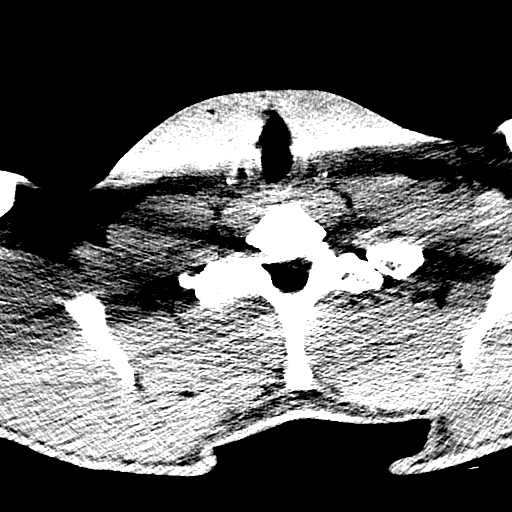
[im 38/114  brain]
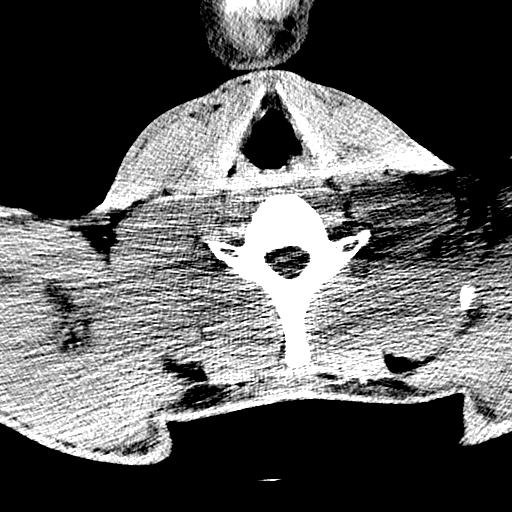
[im 51/114  brain]
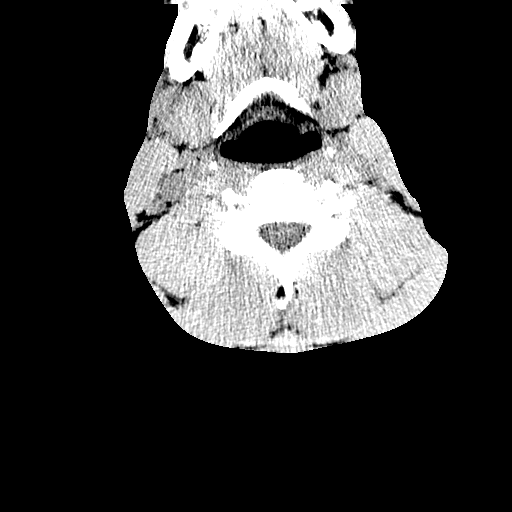
[im 51/114  bone]
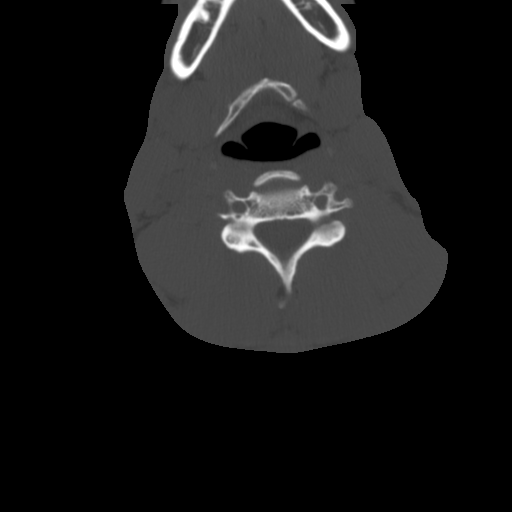
[im 63/114  brain]
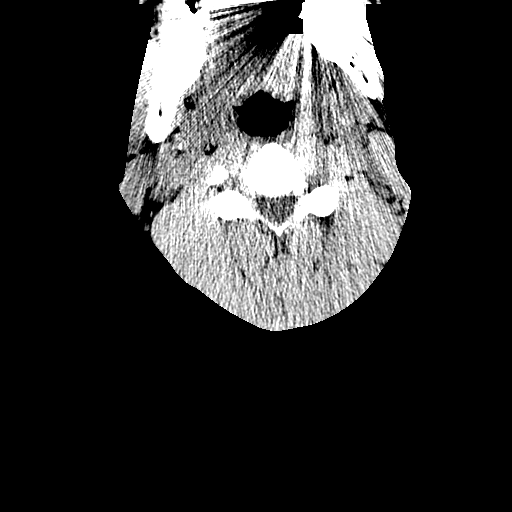
[im 76/114  brain]
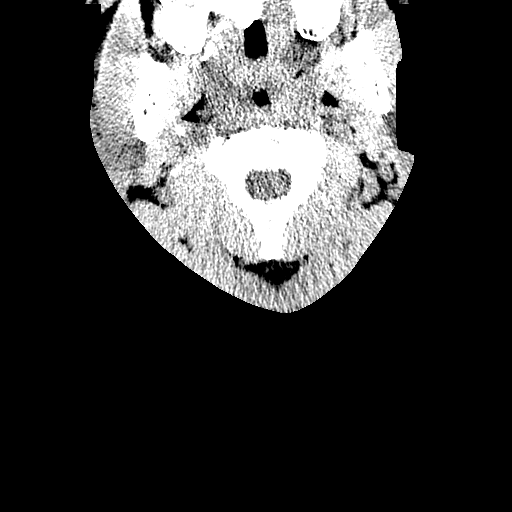
[im 82/114  brain]
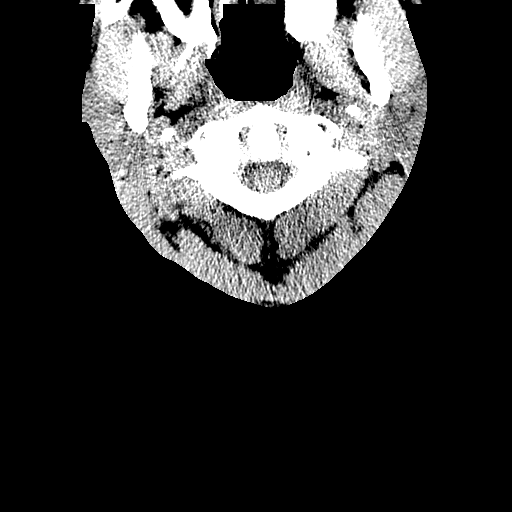
[im 95/114  brain]
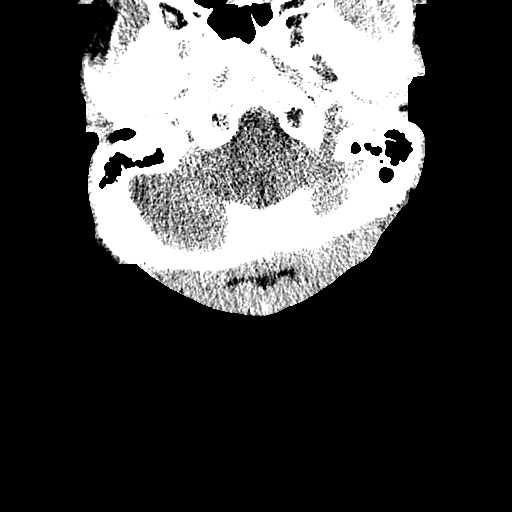
[im 95/114  bone]
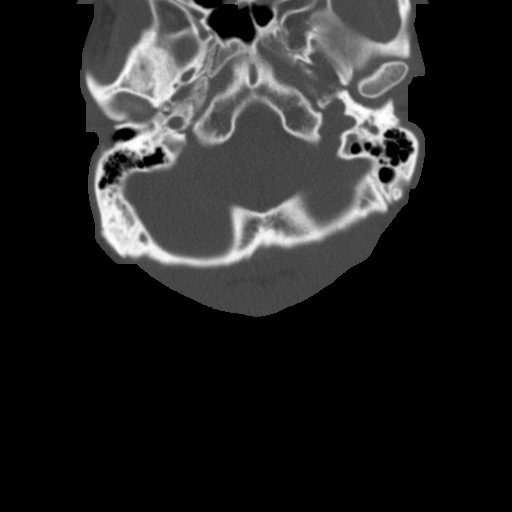
[im 107/114  brain]
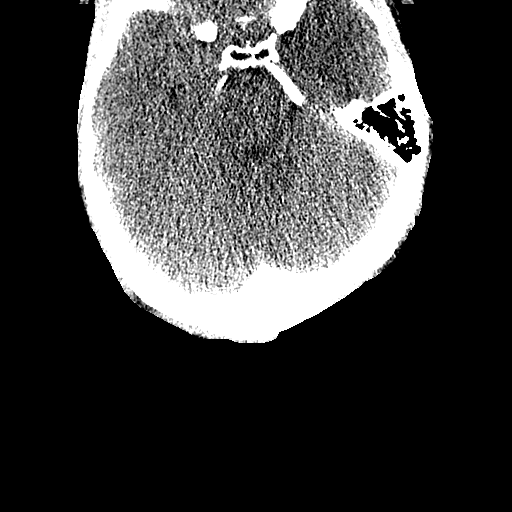

[Series 308: sagittal, idose (2) · sagittal · 0.34mm/px · 2 of 69 slices shown]
[im 23/69  brain]
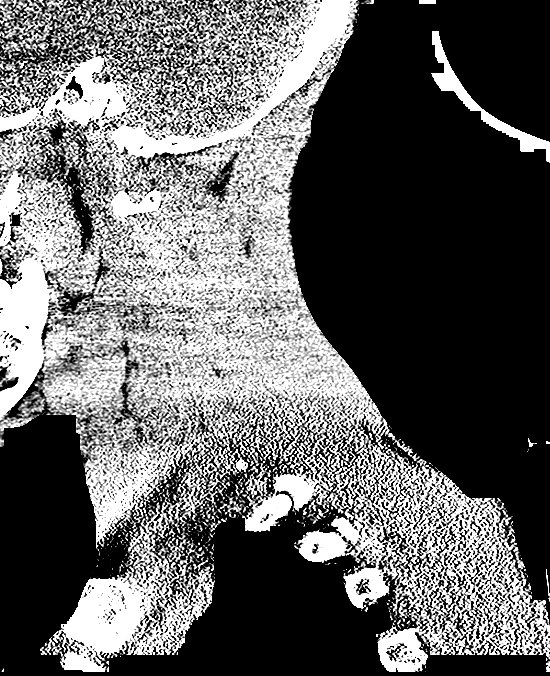
[im 46/69  brain]
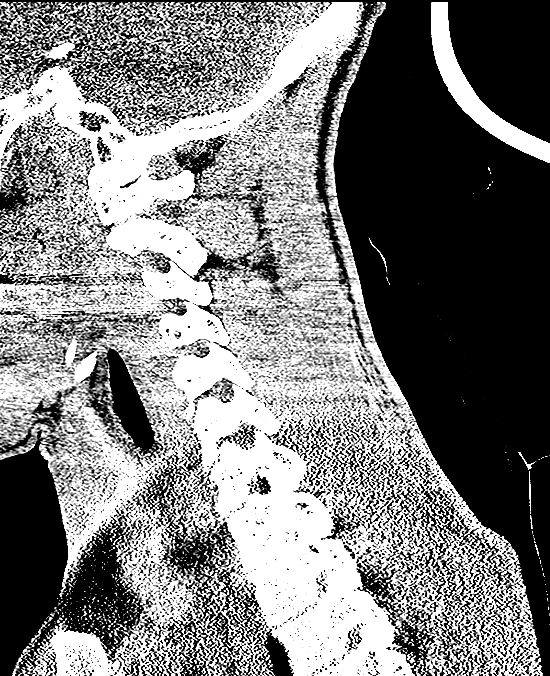

[15 of 47 positions shown; findings below may reference images not displayed]

FINDINGS: CT HEAD FINDINGS

The ventricles are normal in size and configuration. There is no
intracranial mass, hemorrhage, extra-axial fluid collection, or
midline shift. Gray-white compartments appear normal. No acute
infarct evident. The bony calvarium appears intact. The mastoid air
cells are clear. Orbits appear symmetric bilaterally. There is
mucosal thickening in both maxillary antra. There is opacification
of multiple ethmoid air cells bilaterally.

CT CERVICAL SPINE FINDINGS

There is no fracture or spondylolisthesis. Prevertebral soft tissues
and predental space regions are normal. There is slight disc space
narrowing at C7-T1. There is slightly greater narrowing of the disc
at T1-2. There is no nerve root edema or effacement. No disc
extrusion or stenosis.
IMPRESSION: CT head: Areas of paranasal sinus disease. No intracranial mass,
hemorrhage, or extra-axial fluid collection. Gray-white compartments
appear normal.

CT cervical spine: Osteoarthritic change at C7-T1 and T1-2. No
fracture or spondylolisthesis. No nerve root edema or effacement. No
disc extrusion or stenosis.

## 2016-09-14 IMAGING — CT CT ABD-PELV W/ CM
2 of 5 series · 15 of 36 positions shown, 18 images · IV contrast (Iodine)
Comparison: Plain films same date

CLINICAL DATA: 24-year-old male with fall from building

EXAM:
CT ABDOMEN AND PELVIS WITH CONTRAST
TECHNIQUE: Multidetector CT imaging of the abdomen and pelvis was performed
using the standard protocol following bolus administration of
intravenous contrast.
CONTRAST:  100 cc [id number]

[Series 201: cap with, idose (2) · axial · 0.87mm/px · z∈[-626,-66]mm · 11 of 136 slices shown, 14 images]
[im 12/136  mediastinal]
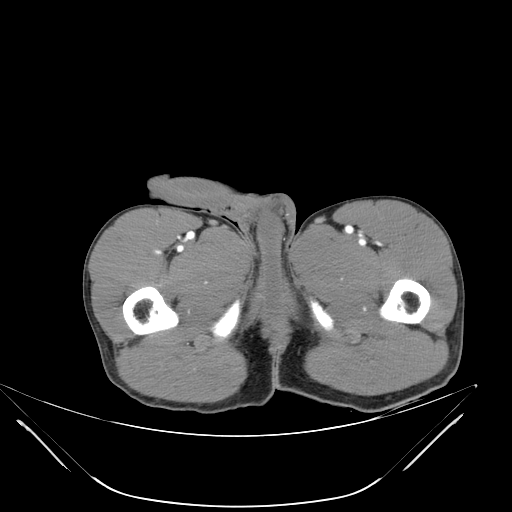
[im 12/136  lung]
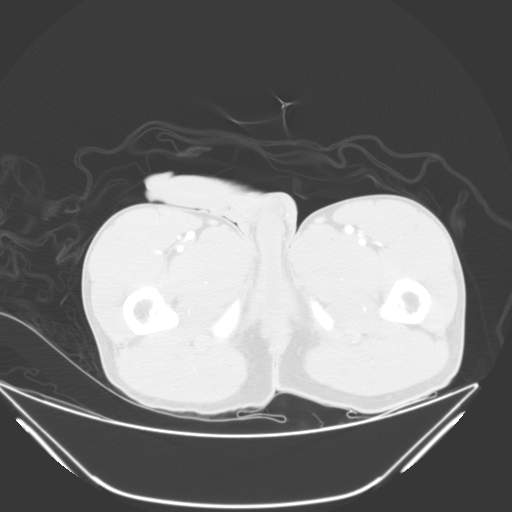
[im 23/136  lung]
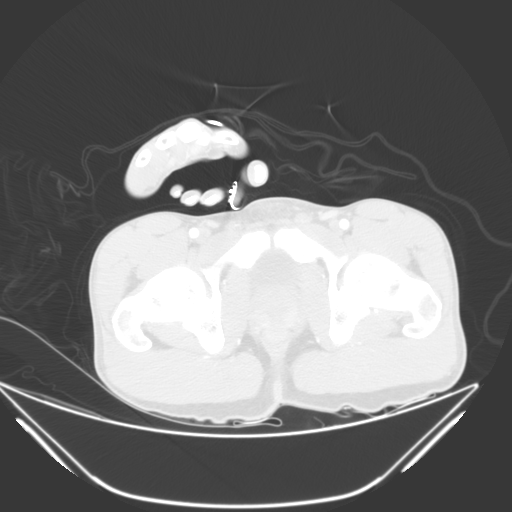
[im 34/136  lung]
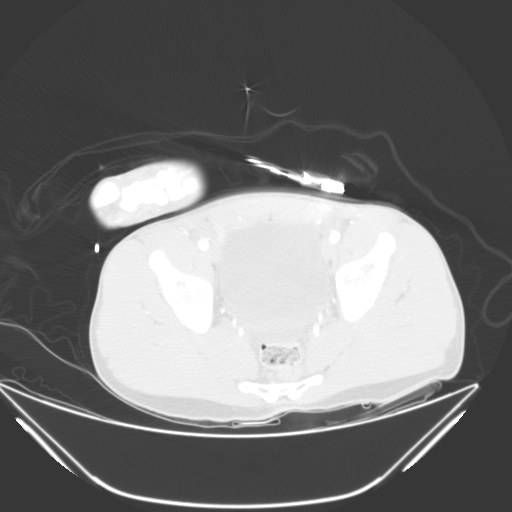
[im 46/136  lung]
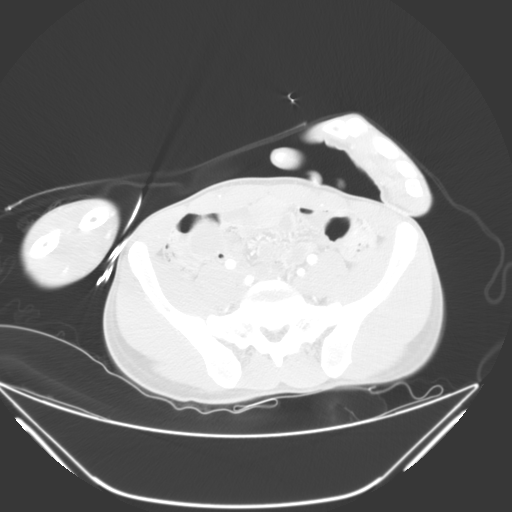
[im 57/136  mediastinal]
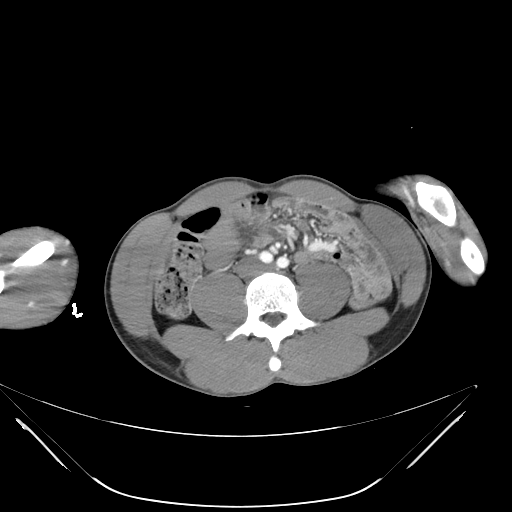
[im 57/136  lung]
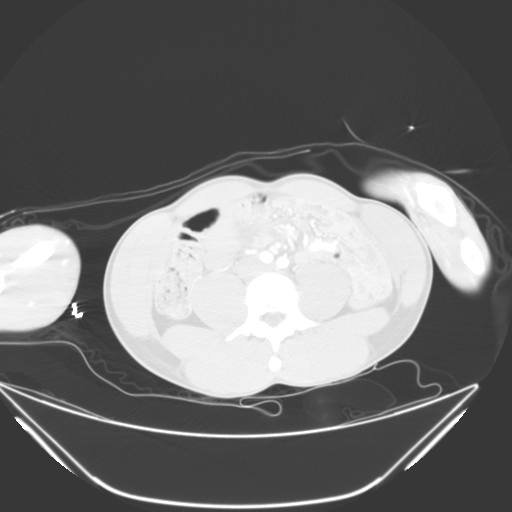
[im 68/136  lung]
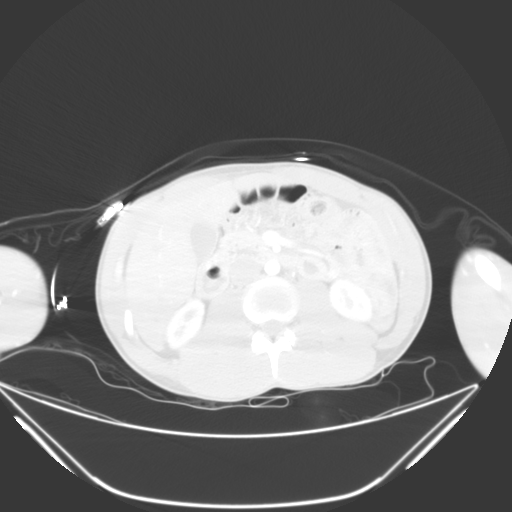
[im 79/136  lung]
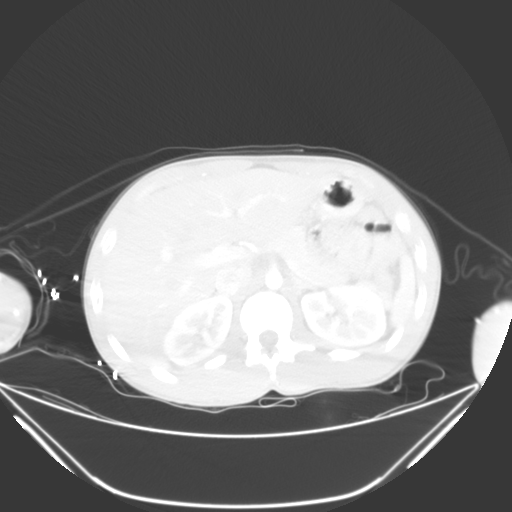
[im 91/136  lung]
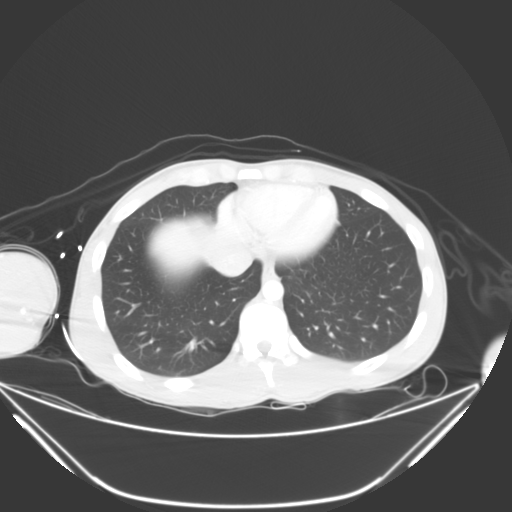
[im 102/136  mediastinal]
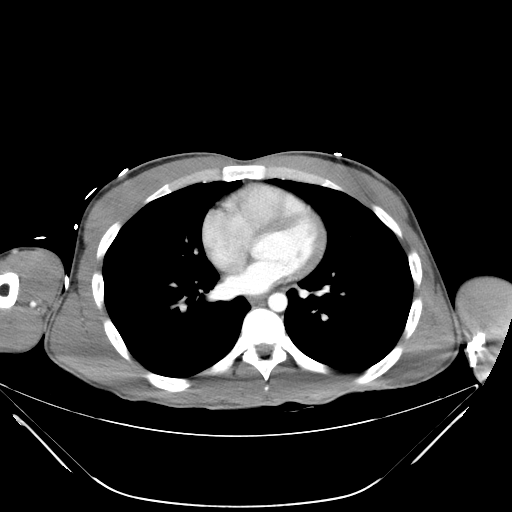
[im 102/136  lung]
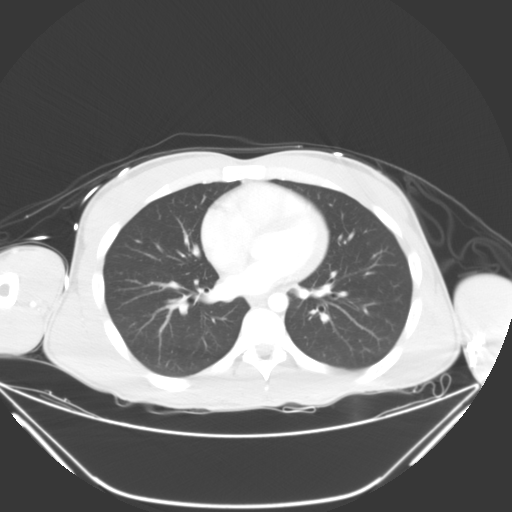
[im 113/136  lung]
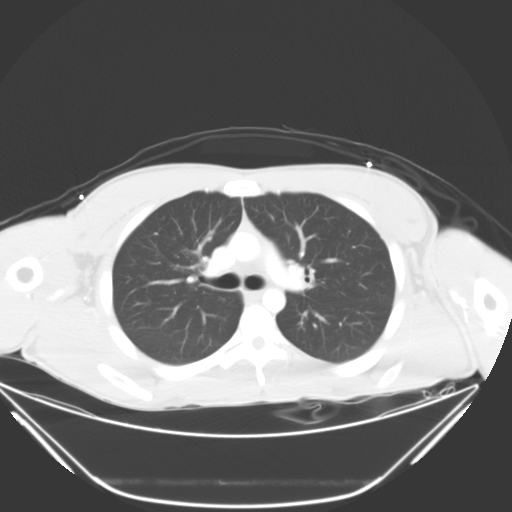
[im 124/136  lung]
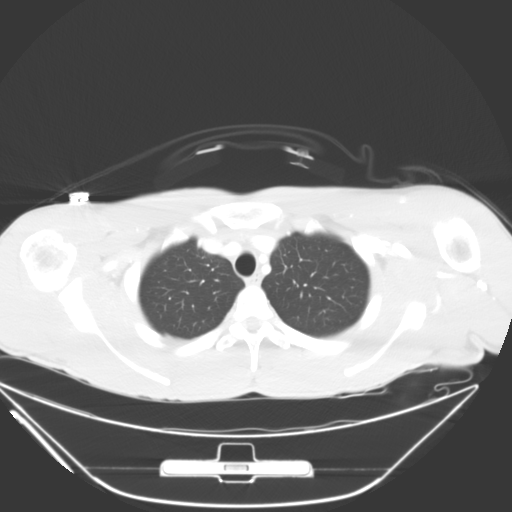

[Series 203: cap with lungs, idose (3) · axial · 0.87mm/px · z∈[-273,-183]mm · 4 of 119 slices shown]
[im 12/119  lung]
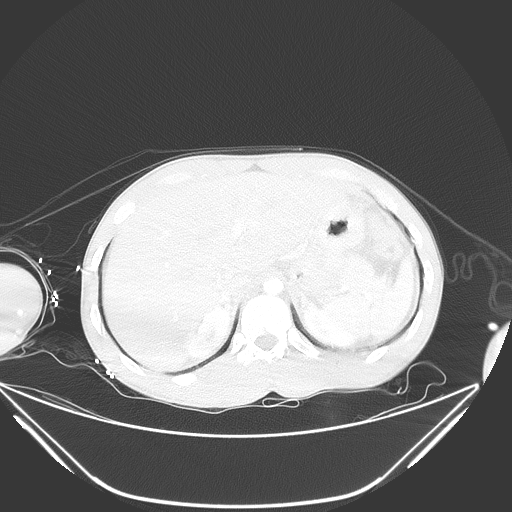
[im 24/119  lung]
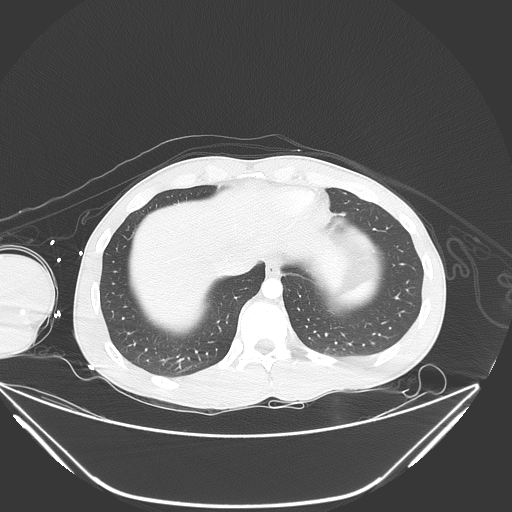
[im 36/119  lung]
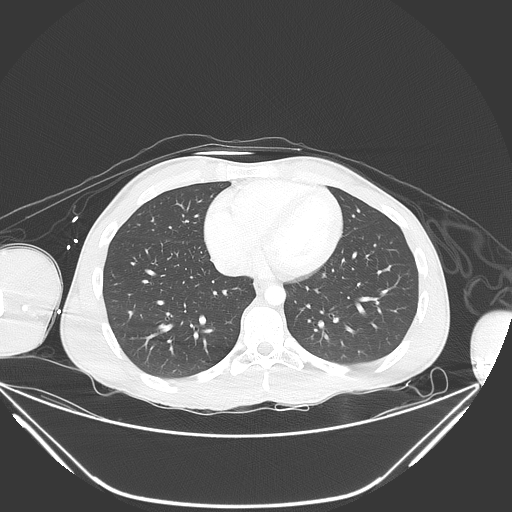
[im 48/119  lung]
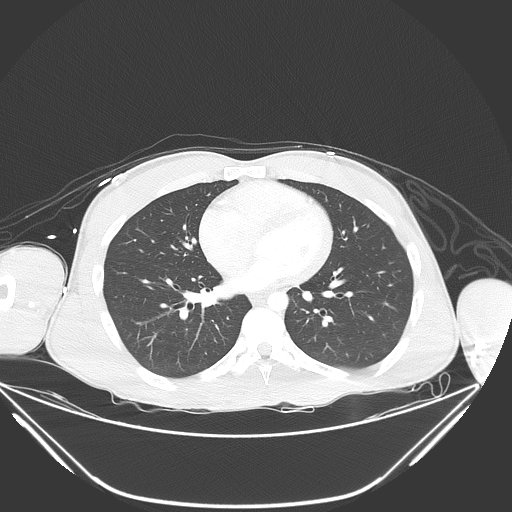

[15 of 36 positions shown; findings below may reference images not displayed]

FINDINGS: Chest:

Unremarkable appearance of the chest superficial soft tissues.

No axillary or supraclavicular adenopathy.

Unremarkable appearance of the thoracic inlet, including the
visualized thyroid.

Several mediastinal lymph nodes, none of which are enlarged by CT
size criteria or have suspicious features.

Unremarkable appearance of the esophagus.

Unremarkable course caliber and contour of the thoracic aorta
without dissection flap, aneurysm, periaortic fluid.

Unremarkable appearance of the pulmonary arterial system.

Heart size within normal limits without pericardial fluid/
thickening.

No confluent airspace disease.

No pneumothorax or pleural effusion.

Abdomen/pelvis:

Unremarkable appearance of liver and spleen.

Unremarkable appearance of bilateral adrenal glands.

No peripancreatic or pericholecystic fluid or inflammatory changes.

No radio-opaque gallstones.

No intrahepatic or extrahepatic biliary ductal dilatation.

No intra-peritoneal free air or significant free-fluid.

No abnormally dilated small bowel or colon. No transition point. No
inflammatory changes of the mesenteries.

Normal appendix identified.

No diverticular disease.

Right Kidney/Ureter:

No hydronephrosis. No nephrolithiasis. No perinephric stranding.
Unremarkable course of the right ureter.

Left Kidney/Ureter:

No hydronephrosis. No nephrolithiasis. No perinephric stranding.

Unremarkable course of the left ureter.

Unremarkable appearance of the urinary bladder.

No significant vascular calcification. No aneurysm or periaortic
fluid identified.

Musculoskeletal:

No displaced fracture identified.

No significant degenerative changes of the spine.
IMPRESSION: No acute finding of the chest, abdomen, pelvis.

## 2023-10-30 ENCOUNTER — Emergency Department (HOSPITAL_COMMUNITY): Payer: Self-pay

## 2023-10-30 ENCOUNTER — Emergency Department (HOSPITAL_COMMUNITY)
Admission: EM | Admit: 2023-10-30 | Discharge: 2023-10-30 | Disposition: A | Discharge status: Home / Self Care | Payer: Self-pay | Attending: Emergency Medicine | Admitting: Emergency Medicine

## 2023-10-30 ENCOUNTER — Encounter (HOSPITAL_COMMUNITY): Payer: Self-pay

## 2023-10-30 ENCOUNTER — Other Ambulatory Visit: Payer: Self-pay

## 2023-10-30 DIAGNOSIS — Z20822 Contact with and (suspected) exposure to covid-19: Secondary | ICD-10-CM | POA: Diagnosis not present

## 2023-10-30 DIAGNOSIS — R519 Headache, unspecified: Secondary | ICD-10-CM | POA: Diagnosis not present

## 2023-10-30 DIAGNOSIS — K0889 Other specified disorders of teeth and supporting structures: Secondary | ICD-10-CM | POA: Insufficient documentation

## 2023-10-30 DIAGNOSIS — R55 Syncope and collapse: Secondary | ICD-10-CM | POA: Insufficient documentation

## 2023-10-30 DIAGNOSIS — R6883 Chills (without fever): Secondary | ICD-10-CM | POA: Diagnosis not present

## 2023-10-30 DIAGNOSIS — E876 Hypokalemia: Secondary | ICD-10-CM | POA: Diagnosis not present

## 2023-10-30 DIAGNOSIS — R0981 Nasal congestion: Secondary | ICD-10-CM | POA: Diagnosis not present

## 2023-10-30 DIAGNOSIS — R059 Cough, unspecified: Secondary | ICD-10-CM | POA: Diagnosis not present

## 2023-10-30 LAB — CBC WITH DIFFERENTIAL/PLATELET
Abs Immature Granulocytes: 0.02 10*3/uL (ref 0.00–0.07)
Basophils Absolute: 0 10*3/uL (ref 0.0–0.1)
Basophils Relative: 0 %
Eosinophils Absolute: 0.1 10*3/uL (ref 0.0–0.5)
Eosinophils Relative: 1 %
HCT: 40.7 % (ref 39.0–52.0)
Hemoglobin: 13.4 g/dL (ref 13.0–17.0)
Immature Granulocytes: 0 %
Lymphocytes Relative: 29 %
Lymphs Abs: 2 10*3/uL (ref 0.7–4.0)
MCH: 30.5 pg (ref 26.0–34.0)
MCHC: 32.9 g/dL (ref 30.0–36.0)
MCV: 92.5 fL (ref 80.0–100.0)
Monocytes Absolute: 0.9 10*3/uL (ref 0.1–1.0)
Monocytes Relative: 13 %
Neutro Abs: 3.9 10*3/uL (ref 1.7–7.7)
Neutrophils Relative %: 57 %
Platelets: 247 10*3/uL (ref 150–400)
RBC: 4.4 MIL/uL (ref 4.22–5.81)
RDW: 13.2 % (ref 11.5–15.5)
WBC: 7 10*3/uL (ref 4.0–10.5)
nRBC: 0 % (ref 0.0–0.2)

## 2023-10-30 LAB — COMPREHENSIVE METABOLIC PANEL
ALT: 25 U/L (ref 0–44)
AST: 21 U/L (ref 15–41)
Albumin: 3.8 g/dL (ref 3.5–5.0)
Alkaline Phosphatase: 60 U/L (ref 38–126)
Anion gap: 6 (ref 5–15)
BUN: 8 mg/dL (ref 6–20)
CO2: 23 mmol/L (ref 22–32)
Calcium: 8.7 mg/dL — ABNORMAL LOW (ref 8.9–10.3)
Chloride: 107 mmol/L (ref 98–111)
Creatinine, Ser: 0.74 mg/dL (ref 0.61–1.24)
GFR, Estimated: >60 mL/min (ref >60)
Glucose, Bld: 104 mg/dL — ABNORMAL HIGH (ref 70–99)
Potassium: 3.4 mmol/L — ABNORMAL LOW (ref 3.5–5.1)
Sodium: 136 mmol/L (ref 135–145)
Total Bilirubin: 0.5 mg/dL (ref 0.0–1.2)
Total Protein: 8.7 g/dL — ABNORMAL HIGH (ref 6.5–8.1)

## 2023-10-30 LAB — RESP PANEL BY RT-PCR (RSV, FLU A&B, COVID)  RVPGX2
Influenza A by PCR: NEGATIVE
Influenza B by PCR: NEGATIVE
Resp Syncytial Virus by PCR: NEGATIVE
SARS Coronavirus 2 by RT PCR: NEGATIVE

## 2023-10-30 MED ORDER — OXYCODONE-ACETAMINOPHEN 5-325 MG PO TABS: 1.0000 | ORAL_TABLET | Freq: Three times a day (TID) | ORAL | 0 refills | Status: DC | PRN

## 2023-10-30 MED ORDER — OXYCODONE-ACETAMINOPHEN 5-325 MG PO TABS
1.0000 | ORAL_TABLET | ORAL | Status: AC | PRN
  Administered 2023-10-30 (×2): 1 via ORAL
  Filled 2023-10-30 (×2): qty 1

## 2023-10-30 NOTE — ED Triage Notes (Addendum)
 Patient BIB EMS for dental pain x 1 month. Has taken a muscle relaxer, gabapentin, and tylenol without relief. Unable to get into a dentist.

## 2023-10-30 NOTE — ED Provider Triage Note (Signed)
 Emergency Medicine Provider Triage Evaluation Note  Javier Randolph , a 33 y.o. male  was evaluated in triage.  Pt complains of dental pain for 1 month. Has seen a dentist last year for same problem but has not undergone Tx due to money concerns. States he has passed out 3x times today, each happening after standing up from sitting. Family denies seeing any seizure activity. Has taken Midol today 4 tablets. States he is allergic to morphine, ibuprofen, ASA. Has inhaler at home, has used it today. Denies alcohol. Denies drug use.   Review of Systems  Positive: Headaches, Chest pain started today, Shortness of breath, nausea Negative: Fevers, Vomiting, dysuria, abdominal pain  Physical Exam  BP 132/88   Pulse 83   Temp 98.3 F (36.8 C) (Oral)   Resp 16   Ht 5' 9 (1.753 m)   Wt 79.4 kg   SpO2 99%   BMI 25.84 kg/m  Gen:   Awake, no distress, zones in and out of conversation Resp:  Normal effort  MSK:   Moves extremities without difficulty  Other:    Medical Decision Making  Medically screening exam initiated at 1:35 PM.  Appropriate orders placed.  Javier Randolph was informed that the remainder of the evaluation will be completed by another provider, this initial triage assessment does not replace that evaluation, and the importance of remaining in the ED until their evaluation is complete.     Javier Randolph, [place] 10/30/23 1346

## 2023-10-30 NOTE — Discharge Instructions (Addendum)
 Today you were seen for dental pain.  Please pick up your medication and take as needed for severe pain.  Please schedule a follow-up with a dentist as soon as possible.  Thank you for letting us  treat you today. After performing a physical exam, I feel you are safe to go home. Please follow up with your PCP in the next several days and provide them with your records from this visit. Return to the Emergency Room if pain becomes severe or symptoms worsen.

## 2023-10-30 NOTE — ED Provider Notes (Signed)
 [place] EMERGENCY DEPARTMENT AT [facility] Provider Note   CSN: [id number] Arrival date & time: 10/30/23  1137     History  Chief Complaint  Patient presents with   Dental Pain    Javier Randolph is a 33 y.o. male presents today for tooth pain x 1 month.  Patient also notes that he has had cough, congestion, headache, and chills the past week.  Patient has been unable to get in with a dentist.  Patient also notes an episode of syncope this morning which he felt like was due to pain from his mouth.  Patient has tried gabapentin, Tylenol , and a muscle relaxer without relief.  Patient denies shortness of breath, chest pain, body aches, nausea, vomiting, diarrhea, or abdominal pain.   Dental Pain Associated symptoms: congestion and headaches        Home Medications Prior to Admission medications   Medication Sig Start Date End Date Taking? Authorizing Provider  oxyCODONE -acetaminophen  (PERCOCET/ROXICET) 5-325 MG tablet Take 1 tablet by mouth every 8 (eight) hours as needed for severe pain (pain score 7-10). 10/30/23  Yes Francis Ileana SAILOR, PA-C      Allergies    Aspirin, Morphine, and Motrin [ibuprofen]    Review of Systems   Review of Systems  Constitutional:  Positive for chills.  HENT:  Positive for congestion and dental problem.   Respiratory:  Positive for cough.   Neurological:  Positive for headaches.    Physical Exam Updated Vital Signs BP 132/88   Pulse 83   Temp 98.3 F (36.8 C) (Oral)   Resp 16   Ht 5' 9 (1.753 m)   Wt 79.4 kg   SpO2 99%   BMI 25.84 kg/m  Physical Exam Vitals and nursing note reviewed.  Constitutional:      General: He is not in acute distress.    Appearance: Normal appearance. He is well-developed. He is not ill-appearing.  HENT:     Head: Normocephalic and atraumatic.     Right Ear: External ear normal.     Left Ear: External ear normal.     Nose: Congestion present.     Mouth/Throat:     Dentition: Dental  tenderness present. No dental caries or dental abscesses.     Pharynx: Uvula midline.      Comments: Patient's third molar partially erupted with tenderness to palpation.  Minimal erythema and no signs of infection or abscess.  Patient also has no signs of Ludwig angina or mouth floor swelling. Eyes:     Extraocular Movements: Extraocular movements intact.     Conjunctiva/sclera: Conjunctivae normal.  Cardiovascular:     Rate and Rhythm: Normal rate and regular rhythm.     Pulses: Normal pulses.     Heart sounds: Normal heart sounds. No murmur heard. Pulmonary:     Effort: Pulmonary effort is normal. No respiratory distress.     Breath sounds: Normal breath sounds.  Abdominal:     Palpations: Abdomen is soft.     Tenderness: There is no abdominal tenderness.  Musculoskeletal:        General: No swelling.     Cervical back: Neck supple.  Skin:    General: Skin is warm and dry.     Capillary Refill: Capillary refill takes less than 2 seconds.  Neurological:     General: No focal deficit present.     Mental Status: He is alert.     Motor: No weakness.  Psychiatric:  Mood and Affect: Mood normal.     ED Results / Procedures / Treatments   Labs (all labs ordered are listed, but only abnormal results are displayed) Labs Reviewed  COMPREHENSIVE METABOLIC PANEL - Abnormal; Notable for the following components:      Result Value   Potassium 3.4 (*)    Glucose, Bld 104 (*)    Calcium 8.7 (*)    Total Protein 8.7 (*)    All other components within normal limits  RESP PANEL BY RT-PCR (RSV, FLU A&B, COVID)  [id number]  CBC WITH DIFFERENTIAL/PLATELET    EKG None  Radiology No results found.  Procedures Procedures    Medications Ordered in ED Medications  oxyCODONE -acetaminophen  (PERCOCET/ROXICET) 5-325 MG per tablet 1 tablet (1 tablet Oral Given 10/30/23 1402)    ED Course/ Medical Decision Making/ A&P                                 Medical Decision  Making Risk Prescription drug management.   This patient presents to the ED with chief complaint(s) of dental pain, URI symptoms, syncope with pertinent past medical history of none which further complicates the presenting complaint. The complaint involves an extensive differential diagnosis and also carries with it a high risk of complications and morbidity.    The differential diagnosis includes cardiac arrhythmia, dental abscess, dental pain, COVID, flu, RSV, orthostatic hypotension  Additional history obtained: Additional history obtained from family  ED Course and Reassessment:   Independent labs interpretation:  The following labs were independently interpreted:  CBC: No notable findings CMP: Mild hypokalemia, mild hypocalcemia, mildly elevated total protein Respiratory panel: Negative EKG: Normal sinus rhythm Orthostatic vital signs:  Orthostatic Lying BP- Lying: 130/74 Pulse- Lying: 67 Orthostatic Sitting BP- Sitting: 132/83 Pulse- Sitting: 75 Orthostatic Standing at 0 minutes BP- Standing at 0 minutes: 132/88 Pulse- Standing at 0 minutes: 72 Orthostatic Standing at 3 minutes BP- Standing at 3 minutes: 131/76 Pulse- Standing at 3 minutes: 70   Independent visualization of imaging: - I independently visualized the following imaging with scope of interpretation limited to determining acute life threatening conditions related to emergency care: Chest x-ray, which revealed no acute cardiopulmonary findings.  Consultation: - Consulted or discussed management/test interpretation w/ external professional: None  Consideration for admission or further workup: Considered for mission further workup however patient's vital signs, physical exam, labs, and imaging of all been reassuring.  Patient's symptoms likely due to having right third molar eruption.  Patient given short course of pain medication outpatient and dental resources to follow-up with a dentist for further evaluation  and treatment.  Patient syncope likely vasovagal in nature given negative EKG, chest x-ray, and orthostatic vital signs.  Patient encouraged to increase fluid intake.        Final Clinical Impression(s) / ED Diagnoses Final diagnoses:  Pain, dental    Rx / DC Orders ED Discharge Orders          Ordered    oxyCODONE -acetaminophen  (PERCOCET/ROXICET) 5-325 MG tablet  Every 8 hours PRN        10/30/23 1530              Tinia Oravec N, PA-C 10/30/23 1555    Ellouise, Victoria K, DO 10/31/23 0700

## 2023-11-10 ENCOUNTER — Emergency Department (HOSPITAL_COMMUNITY)
Admission: EM | Admit: 2023-11-10 | Discharge: 2023-11-10 | Disposition: A | Discharge status: Home / Self Care | Payer: 59 | Attending: Emergency Medicine | Admitting: Emergency Medicine

## 2023-11-10 ENCOUNTER — Encounter (HOSPITAL_COMMUNITY): Payer: Self-pay | Admitting: Emergency Medicine

## 2023-11-10 DIAGNOSIS — J45909 Unspecified asthma, uncomplicated: Secondary | ICD-10-CM | POA: Insufficient documentation

## 2023-11-10 DIAGNOSIS — K0889 Other specified disorders of teeth and supporting structures: Secondary | ICD-10-CM | POA: Insufficient documentation

## 2023-11-10 MED ORDER — OXYCODONE HCL 5 MG PO CAPS: 5.0000 mg | ORAL_CAPSULE | ORAL | 0 refills | Status: AC | PRN

## 2023-11-10 MED ORDER — OXYCODONE-ACETAMINOPHEN 5-325 MG PO TABS
1.0000 | ORAL_TABLET | Freq: Once | ORAL | Status: AC
  Administered 2023-11-10: 1 via ORAL
  Filled 2023-11-10: qty 1

## 2023-11-10 NOTE — ED Triage Notes (Signed)
Pt here for continued dental pain , was here fro same 1 week ago , has appointment to have teeth pulled but is still 3 weeks away, thinks he might need antibiotics

## 2023-11-10 NOTE — ED Provider Notes (Signed)
[place]  EMERGENCY DEPARTMENT AT [facility] Provider Note   CSN: [id number] Arrival date & time: 11/10/23  1037     History  No chief complaint on file.   Javier Randolph is a 33 y.o. male.  Patient with history of asthma presents today with complaints of dental pain. States same has been worsening over the past few weeks. Saw a dentist at some point and was told that he needed to have his wisdom teeth removed as they are impacted. He has an appointment to have this done, however it is still 3 weeks out and his pain is worsening. He was seen for same on 1/13 and was told to take tylenol and was prescribed percocet. He states that the percocet was helping his pain, however he ran out recently. 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 Pain is predominantly in his right lower molar, however does note pain in all his back molars as well. He has been trying topical numbing gel as well with minimal relief. He denies any fever/chills, fatigue, shortness of breath, chest pain or palpitations, N/V. No trouble swallowing.   The history is provided by the patient. No language interpreter was used.       Home Medications Prior to Admission medications   Medication Sig Start Date End Date Taking? Authorizing Provider  oxyCODONE-acetaminophen (PERCOCET/ROXICET) 5-325 MG tablet Take 1 tablet by mouth every 8 (eight) hours as needed for severe pain (pain score 7-10). 10/30/23   Dolphus Jenny, PA-C      Allergies    Aspirin, Morphine, and Motrin [ibuprofen]    Review of Systems   Review of Systems  HENT:  Positive for dental problem.   All other systems reviewed and are negative.   Physical Exam Updated Vital Signs BP 138/88 (BP Location: Right Arm)   Pulse (!) 104   Temp 98.1 F (36.7 C) (Oral)   Resp 18   Ht 5\' 9"  (1.753 m)   Wt 79.4 kg   SpO2 100%   BMI 25.84 kg/m  Physical Exam Vitals and nursing note reviewed.  Constitutional:      General: He is  not in acute distress.    Appearance: Normal appearance. He is normal weight. He is not ill-appearing, toxic-appearing or diaphoretic.  HENT:     Head: Normocephalic and atraumatic.     Mouth/Throat:     Comments: Right back lower molar appears impacted against the neighboring molar, likely cause of pain. No gross abscess or signs of infection. No trismus Cardiovascular:     Rate and Rhythm: Normal rate.  Pulmonary:     Effort: Pulmonary effort is normal. No respiratory distress.  Musculoskeletal:        General: Normal range of motion.     Cervical back: Normal range of motion.  Skin:    General: Skin is warm and dry.  Neurological:     General: No focal deficit present.     Mental Status: He is alert.  Psychiatric:        Mood and Affect: Mood normal.        Behavior: Behavior normal.     ED Results / Procedures / Treatments   Labs (all labs ordered are listed, but only abnormal results are displayed) Labs Reviewed - No data to display  EKG None  Radiology No results found.  Procedures Procedures    Medications Ordered in ED Medications  oxyCODONE-acetaminophen (PERCOCET/ROXICET) 5-325 MG per tablet 1 tablet (1 tablet Oral  Given 11/10/23 1127)    ED Course/ Medical Decision Making/ A&P                                 Medical Decision Making Risk Prescription drug management.   Patient presents today with complaints of dental pain for over a month. He has seen a dentist and has been told that he needs to have all his wisdom teeth removed. Physical exam reveals right back molar appears to be impacted against the neighboring tooth, likely cause of symptoms.  No signs of cavity infection. No gross abscess.  Exam unconcerning for Ludwig's angina or spread of infection. 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 Given this, will prescribe a few doses of narcotic pain medication for pain control. PDMP reviewed.  Patient  advised not to drive or operate heavy machinery while taking this medication.  No indication for antibiotics.  Urged patient to follow-up with dentist.  Resources given for same. Evaluation and diagnostic testing in the emergency department does not suggest an emergent condition requiring admission or immediate intervention beyond what has been performed at this time.  Plan for discharge with close PCP follow-up.  Patient is understanding and amenable with plan, educated on red flag symptoms that would prompt immediate return.  Patient discharged in stable condition.  Final Clinical Impression(s) / ED Diagnoses Final diagnoses:  Pain, dental    Rx / DC Orders ED Discharge Orders          Ordered    oxycodone (OXY-IR) 5 MG capsule  Every 4 hours PRN        11/10/23 1148          An After Visit Summary was printed and given to the patient.     Vear Clock 11/10/23 1149    Tegeler, Canary Brim, MD 11/10/23 1400

## 2023-11-10 NOTE — Discharge Instructions (Addendum)
You were seen in the emergency department for dental pain.  As we discussed I think your pain is likely related to your impacted wisdom teeth. We normally treat this with anti-inflammatories and prescribed pain medication.  Do not drive or operate heavy machinery while taking this medication as it can be sedating.  Please use acetaminophen (Tylenol) for pain.  You may use 1000 mg of acetaminophen every 6 hours.  Do not exceed 4000 mg of acetaminophen within 24 hours.   I've attached a resource guide with several dentists in the area. It's incredibly important you follow up with a dentist as soon as possible for definitive treatment.   Continue to monitor how you're doing and return to the ER for new or worsening symptoms such as difficulty swallowing your own saliva, difficulty breathing, or fever.
